# Patient Record
Sex: Male | Born: 1980 | Race: Black or African American | Hispanic: No | Marital: Single | Smoking: Former smoker
Health system: Southern US, Community
[De-identification: ages and names within clinical notes are randomized; demographics above are authoritative.]

## PROBLEM LIST (undated history)

## (undated) DIAGNOSIS — C8190 Hodgkin lymphoma, unspecified, unspecified site: Secondary | ICD-10-CM

---

## 2013-02-09 ENCOUNTER — Emergency Department (HOSPITAL_COMMUNITY)
Admission: EM | Admit: 2013-02-09 | Discharge: 2013-02-09 | Disposition: A | Discharge status: Home / Self Care | Payer: Self-pay | Attending: Emergency Medicine | Admitting: Emergency Medicine

## 2013-02-09 ENCOUNTER — Encounter (HOSPITAL_COMMUNITY): Payer: Self-pay | Admitting: Emergency Medicine

## 2013-02-09 ENCOUNTER — Emergency Department (HOSPITAL_COMMUNITY): Payer: Self-pay

## 2013-02-09 DIAGNOSIS — Z87891 Personal history of nicotine dependence: Secondary | ICD-10-CM | POA: Insufficient documentation

## 2013-02-09 DIAGNOSIS — Z8571 Personal history of Hodgkin lymphoma: Secondary | ICD-10-CM | POA: Insufficient documentation

## 2013-02-09 DIAGNOSIS — J3489 Other specified disorders of nose and nasal sinuses: Secondary | ICD-10-CM | POA: Insufficient documentation

## 2013-02-09 DIAGNOSIS — R1310 Dysphagia, unspecified: Secondary | ICD-10-CM | POA: Insufficient documentation

## 2013-02-09 DIAGNOSIS — Z9481 Bone marrow transplant status: Secondary | ICD-10-CM | POA: Insufficient documentation

## 2013-02-09 DIAGNOSIS — R51 Headache: Secondary | ICD-10-CM | POA: Insufficient documentation

## 2013-02-09 DIAGNOSIS — R609 Edema, unspecified: Secondary | ICD-10-CM | POA: Insufficient documentation

## 2013-02-09 HISTORY — DX: Hodgkin lymphoma, unspecified, unspecified site: C81.90

## 2013-02-09 LAB — COMPREHENSIVE METABOLIC PANEL
Albumin: 4 g/dL (ref 3.5–5.2)
Alkaline Phosphatase: 59 U/L (ref 39–117)
BUN: 10 mg/dL (ref 6–23)
CO2: 27 mEq/L (ref 19–32)
Calcium: 9.4 mg/dL (ref 8.4–10.5)
Chloride: 100 mEq/L (ref 96–112)
GFR calc non Af Amer: >90 mL/min (ref >90)
Glucose, Bld: 122 mg/dL — ABNORMAL HIGH (ref 70–99)
Potassium: 3.5 mEq/L (ref 3.5–5.1)
Sodium: 137 mEq/L (ref 135–145)
Total Bilirubin: 0.3 mg/dL (ref 0.3–1.2)

## 2013-02-09 LAB — CBC
HCT: 42.5 % (ref 39.0–52.0)
Hemoglobin: 14.5 g/dL (ref 13.0–17.0)
RBC: 4.9 MIL/uL (ref 4.22–5.81)
RDW: 13.8 % (ref 11.5–15.5)
WBC: 10.8 10*3/uL — ABNORMAL HIGH (ref 4.0–10.5)

## 2013-02-09 MED ORDER — KETOROLAC TROMETHAMINE 30 MG/ML IJ SOLN
30.0000 mg | Freq: Once | INTRAMUSCULAR | Status: AC
  Administered 2013-02-09: 30 mg via INTRAMUSCULAR
  Filled 2013-02-09: qty 1

## 2013-02-09 MED ORDER — IBUPROFEN 800 MG PO TABS: 800.0000 mg | ORAL_TABLET | Freq: Three times a day (TID) | ORAL | Status: DC

## 2013-02-09 NOTE — ED Notes (Signed)
Pt reports having headache and sinus pressure x 2 weeks, gets worse when he leans his head down. No acute distress noted at triage.

## 2013-02-09 NOTE — ED Provider Notes (Signed)
CSN: [id number]     Arrival date & time 02/09/13  1832 History  This chart was scribed for non-physician practitioner Mellody Drown, PA-C working with Roney Marion, MD by Joaquin Music, ED Scribe. This patient was seen in room TR08C/TR08C and the patient's care was started at 8:42 PM .   Chief Complaint  Patient presents with  . Headache   The history is provided by the patient. No language interpreter was used.   HPI Comments: Nicholas Garcia is a 32 y.o. 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 He states the pressure is constantly throughout his head and states a HA generally follows the pressure. Pt states he tried taking OTC Tylenol but states the Tylenol became ineffective after 2 days. Pt denies taking any current medications. He states he has been having trouble swallowing but denies having a sore throat. He states bending head forward and backward worsen his pressure. Pt denies hx of seizures. Pt denies nausea, vomiting, fever, chills, light-headedness, and visual disturbances.  Pt states he was diagnosed with Hodgkin Disease in 2006 and states he had a bone marrow transplant in 2008. He states his PCP is Dr. Conchita Paris in [place] and states he has not seen his PCP in over 5 months. He states he goes to his PCP when needed.   Past Medical History  Diagnosis Date  . Hodgkin disease    History reviewed. No pertinent past surgical history. History reviewed. No pertinent family history. History  Substance Use Topics  . Smoking status: Former Games developer  . Smokeless tobacco: Not on file  . Alcohol Use: Yes     Comment: social    Review of Systems  Constitutional: Negative for fever and chills.  HENT: Negative for sore throat.   Eyes: Negative for visual disturbance.  Gastrointestinal: Negative for nausea and vomiting.   Neurological: Positive for headaches (w/pressure). Negative for dizziness, seizures and light-headedness.    Allergies  Shrimp  Home Medications  No current outpatient prescriptions on file.  Triage Vitals:BP 143/73  Pulse 96  Temp(Src) 97.6 F (36.4 C) (Oral)  Resp 20  Wt 250 lb (113.399 kg)  SpO2 99%  Physical Exam  Nursing note reviewed. Constitutional: He is oriented to person, place, and time. He appears well-developed and well-nourished.  HENT:  Head: Normocephalic and atraumatic.  Right Ear: Tympanic membrane normal.  Left Ear: Tympanic membrane normal.  Nose: Mucosal edema present.  Mouth/Throat: Uvula is midline and oropharynx is clear and moist.  Slight rhinorrhea.  Eyes: EOM are normal. Pupils are equal, round, and reactive to light. No scleral icterus.  Neck: Normal range of motion. Neck supple. No thyromegaly present.  Cardiovascular: Normal rate, regular rhythm and normal heart sounds.  Exam reveals no gallop and no friction rub.   No murmur heard. Pulmonary/Chest: Effort normal and breath sounds normal. He has no wheezes. He has no rales.  Abdominal: Soft. There is no tenderness. There is no rebound and no guarding.  Neurological: He is alert and oriented to person, place, and time. No cranial nerve deficit or sensory deficit. Coordination normal.  Skin: Skin is warm and dry.  Psychiatric: He has a normal mood and affect. His behavior is normal.    ED Course  Procedures  DIAGNOSTIC STUDIES: Oxygen Saturation is 99% on RA, normal by my interpretation.    COORDINATION OF CARE:  8:59 PM-Discussed treatment plan which includes consulting with attending for further evaluation. Pt agreed to plan.   10:17 PM-Discussed lab and radiology findings with pt. Will D/C pt with pain medication. Pt agreed to plan.  Labs Review Labs Reviewed  CBC - Abnormal; Notable for the following:    WBC 10.8 (*)    All other components within normal limits  COMPREHENSIVE  METABOLIC PANEL - Abnormal; Notable for the following:    Glucose, Bld 122 (*)    ALT 57 (*)    All other components within normal limits   Imaging Review Ct Head Wo Contrast  02/09/2013   CLINICAL DATA:  Headache.  Hodgkin disease.  EXAM: CT HEAD WITHOUT CONTRAST  TECHNIQUE: Contiguous axial images were obtained from the base of the skull through the vertex without intravenous contrast.  COMPARISON:  None.  FINDINGS: No evidence of intracranial hemorrhage, brain edema, or other signs of acute infarction. No evidence of intracranial mass lesion or mass effect. No abnormal extraaxial fluid collections identified. Ventricles are normal in size. No skull abnormality identified. Mucosal thickening is seen involving the sphenoid and right maxillary sinuses.  IMPRESSION: No intracranial abnormality identified.  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   Electronically Signed   By: Myles Rosenthal M.D.   On: 02/09/2013 22:02    EKG Interpretation   None      MDM   1. Headache    Pt with a history of Hodgkin lymphoma s/p bone marrow transplant 2008 presents with a headache for 2 weeks. No neurologic findings on exam. Discussed history of patient with Dr. Fayrene Fearing, who advises obtaining a CT.  CT-without acute findings related to discomfort. Discussed lab results, imaging results, and treatment plan with the patient. Return precautions given. States headache has resolved. Reports understanding and no other concerns at this time.  Patient is stable for discharge at this time.  Meds given in ED:  Medications  ketorolac (TORADOL) 30 MG/ML injection 30 mg (30 mg Intramuscular Given 02/09/13 2250)    Discharge Medication List as of 02/09/2013 11:34 PM    START taking these medications   Details  ibuprofen (ADVIL,MOTRIN) 800 MG tablet Take 1 tablet (800 mg total) by mouth 3 (three) times daily with meals., Starting 02/09/2013, Until Discontinued, Print       I personally performed the  services described in this documentation, which was scribed in my presence. The recorded information has been reviewed and is accurate.     Clabe Seal, PA-C 02/11/13 2256

## 2013-02-13 NOTE — ED Provider Notes (Signed)
Medical screening examination/treatment/procedure(s) were performed by non-physician practitioner and as supervising physician I was immediately available for consultation/collaboration.  EKG Interpretation   None         Aseel Truxillo J Teondra Newburg, MD 02/13/13 0730 

## 2013-02-17 ENCOUNTER — Emergency Department (HOSPITAL_COMMUNITY): Payer: Self-pay

## 2013-02-17 ENCOUNTER — Encounter (HOSPITAL_COMMUNITY): Payer: Self-pay | Admitting: Emergency Medicine

## 2013-02-17 ENCOUNTER — Emergency Department (HOSPITAL_COMMUNITY)
Admission: EM | Admit: 2013-02-17 | Discharge: 2013-02-17 | Disposition: A | Discharge status: Home / Self Care | Payer: Self-pay | Attending: Emergency Medicine | Admitting: Emergency Medicine

## 2013-02-17 DIAGNOSIS — Z8571 Personal history of Hodgkin lymphoma: Secondary | ICD-10-CM | POA: Insufficient documentation

## 2013-02-17 DIAGNOSIS — J111 Influenza due to unidentified influenza virus with other respiratory manifestations: Secondary | ICD-10-CM | POA: Insufficient documentation

## 2013-02-17 DIAGNOSIS — Z87891 Personal history of nicotine dependence: Secondary | ICD-10-CM | POA: Insufficient documentation

## 2013-02-17 DIAGNOSIS — R5381 Other malaise: Secondary | ICD-10-CM | POA: Insufficient documentation

## 2013-02-17 DIAGNOSIS — Z79899 Other long term (current) drug therapy: Secondary | ICD-10-CM | POA: Insufficient documentation

## 2013-02-17 LAB — CBC WITH DIFFERENTIAL/PLATELET
Basophils Absolute: 0 10*3/uL (ref 0.0–0.1)
Eosinophils Absolute: 0.1 10*3/uL (ref 0.0–0.7)
Eosinophils Relative: 1 % (ref 0–5)
Hemoglobin: 15.2 g/dL (ref 13.0–17.0)
Lymphocytes Relative: 17 % (ref 12–46)
Lymphs Abs: 1.7 10*3/uL (ref 0.7–4.0)
MCH: 29.2 pg (ref 26.0–34.0)
MCV: 86.8 fL (ref 78.0–100.0)
Neutro Abs: 7.5 10*3/uL (ref 1.7–7.7)
Neutrophils Relative %: 72 % (ref 43–77)
Platelets: 265 10*3/uL (ref 150–400)
RBC: 5.21 MIL/uL (ref 4.22–5.81)
RDW: 13.8 % (ref 11.5–15.5)
WBC: 10.4 10*3/uL (ref 4.0–10.5)

## 2013-02-17 LAB — URINE MICROSCOPIC-ADD ON

## 2013-02-17 LAB — BASIC METABOLIC PANEL
Calcium: 9.1 mg/dL (ref 8.4–10.5)
Chloride: 96 mEq/L (ref 96–112)
GFR calc non Af Amer: 80 mL/min — ABNORMAL LOW (ref >90)
Sodium: 136 mEq/L (ref 135–145)

## 2013-02-17 LAB — URINALYSIS, ROUTINE W REFLEX MICROSCOPIC
Bilirubin Urine: NEGATIVE
Glucose, UA: 250 mg/dL — AB
Leukocytes, UA: NEGATIVE
Protein, ur: 100 mg/dL — AB
Urobilinogen, UA: 1 mg/dL (ref 0.0–1.0)

## 2013-02-17 MED ORDER — SODIUM CHLORIDE 0.9 % IV BOLUS (SEPSIS)
1000.0000 mL | Freq: Once | INTRAVENOUS | Status: AC
  Administered 2013-02-17: 1000 mL via INTRAVENOUS

## 2013-02-17 MED ORDER — IBUPROFEN 800 MG PO TABS
800.0000 mg | ORAL_TABLET | Freq: Once | ORAL | Status: AC
  Administered 2013-02-17: 800 mg via ORAL
  Filled 2013-02-17: qty 1

## 2013-02-17 NOTE — ED Notes (Signed)
Bed: ZO10 Expected date:  Expected time:  Means of arrival:  Comments: EMS-flu-like symptoms

## 2013-02-17 NOTE — ED Notes (Signed)
As per EMS ,pt sts has been ill for three days. Pt was unable to stand at work and called EMS

## 2013-02-17 NOTE — ED Provider Notes (Signed)
CSN: [id number]     Arrival date & time 02/17/13  4098 History   First MD Initiated Contact with Patient 02/17/13 1017     Chief Complaint  Patient presents with  . Generalized Body Aches   (Consider location/radiation/quality/duration/timing/severity/associated sxs/prior Treatment) HPI  This is a 32 year old male with past medical history of Hodgkin's lymphoma status post bone marrow transplant in 2000 and he was in remission.  The patient presents with chief complaint of influenza-like illness.  The patient has had symptoms of productive cough, chills, myalgias, decreased appetite, fatigue, sweats, and a fever with a maximum temperature of 102.3 last night.  Patient states he has been using NyQuil at night and Alka-Seltzer cold and flu during the day with some relief of his symptoms.  Patient states that this morning he got up and took Alka-Seltzer at approximately 5:30 AM.  Patient states he was feeling better and went to work.  At work he began feeling as if he were going to pass out, he had some nausea, fatigue, weakness, cold sweats.  The patient was brought to the emergency department via EMS.  He states he was too weak to stand at work.  The patient states he gets yearly.  He states that he frequently get very weak during his illnesses.  He has had some associated loose stools over the past few days without vomiting. Denies DOE, SOB, chest tightness or pressure, radiation to left arm, jaw or back, or diaphoresis. Denies dysuria, flank pain, suprapubic pain, frequency, urgency, or hematuria. Denies headaches, light headedness, visual disturbances. Denies abdominal pain, nausea, vomiting, diarrhea or constipation.   Past Medical History  Diagnosis Date  . Hodgkin disease    History reviewed. No pertinent past surgical history. No family history on file. History  Substance Use Topics  . Smoking status: Former Games developer  . Smokeless tobacco: Not on file  . Alcohol Use: Yes     Comment:  social    Review of Systems Ten systems reviewed and are negative for acute change, except as noted in the HPI.   Allergies  Shrimp  Home Medications   Current Outpatient Rx  Name  Route  Sig  Dispense  Refill  . acetaminophen (TYLENOL) 500 MG tablet   Oral   Take 500 mg by mouth every 6 (six) hours as needed.         Marland Kitchen aspirin-sod bicarb-citric acid (ALKA-SELTZER) 325 MG TBEF tablet   Oral   Take 325 mg by mouth every 6 (six) hours as needed.         Marland Kitchen levothyroxine (SYNTHROID, LEVOTHROID) 100 MCG tablet   Oral   Take 100 mcg by mouth daily before breakfast.          BP 110/68  Pulse 98  Temp(Src) 98.3 F (36.8 C) (Oral)  Resp 14  Ht 6\' 3"  (1.905 m)  Wt 250 lb (113.399 kg)  BMI 31.25 kg/m2  SpO2 97% Physical Exam Appears moderately ill but not toxic; temperature as noted in vitals. Ears normal. Eyes:glassy appearance, no discharge  Heart: RRR, NO M/G/R Throat and pharynx normal.   Neck supple. No adenopathyhy in the neck.  Sinuses non tender.  Chest clear, slight productive cough. Abdomen is soft and non-tender.     ED Course  Procedures (including critical care time) Labs Review Labs Reviewed - No data to display Imaging Review No results found.  EKG Interpretation   None       MDM   1. Influenza-like illness  Filed Vitals:   02/17/13 1049 02/17/13 1100 02/17/13 1211 02/17/13 1230  BP: 112/68 118/73 113/77 114/78  Pulse: 108 103 95 93  Temp:   100.2 F (37.9 C)   TempSrc:   Oral   Resp:   16   Height:      Weight:      SpO2:  96% 97% 94%   Pulse trending down. Patient with Ketones, slight proteinuria. I discussed this with the patient and feel he will need a repeat UA in the near future with PCP.  Mild fever after fluids. Patient states he is feeling better. Negative orthostatics. Patient with symptoms consistent with influenza.  Vitals are stable, low-grade fever.  No signs of dehydration, tolerating PO's.  Lungs are clear.  Due to patient's presentation and physical exam a chest x-ray was not ordered bc likely diagnosis of flu.  Discussed the cost versus benefit of Tamiflu treatment with the patient.  The patient understands that symptoms are greater than the recommended 24-48 hour window of treatment.  Patient will be discharged with instructions to orally hydrate, rest, and use over-the-counter medications such as anti-inflammatories ibuprofen and Aleve for muscle aches and Tylenol for fever.  Patient will also be given a cough suppressant.      Arthor Captain, PA-C 02/18/13 1222

## 2013-02-20 NOTE — ED Provider Notes (Signed)
Medical screening examination/treatment/procedure(s) were performed by non-physician practitioner and as supervising physician I was immediately available for consultation/collaboration.  EKG Interpretation   None        Jasmyne Lodato K Linker, MD 02/20/13 0709 

## 2013-10-26 ENCOUNTER — Emergency Department (HOSPITAL_COMMUNITY)
Admission: EM | Admit: 2013-10-26 | Discharge: 2013-10-27 | Disposition: A | Discharge status: Home / Self Care | Payer: Self-pay | Attending: Emergency Medicine | Admitting: Emergency Medicine

## 2013-10-26 DIAGNOSIS — Y9289 Other specified places as the place of occurrence of the external cause: Secondary | ICD-10-CM | POA: Insufficient documentation

## 2013-10-26 DIAGNOSIS — M545 Low back pain, unspecified: Secondary | ICD-10-CM

## 2013-10-26 DIAGNOSIS — IMO0002 Reserved for concepts with insufficient information to code with codable children: Secondary | ICD-10-CM | POA: Insufficient documentation

## 2013-10-26 DIAGNOSIS — X500XXA Overexertion from strenuous movement or load, initial encounter: Secondary | ICD-10-CM | POA: Insufficient documentation

## 2013-10-26 DIAGNOSIS — Y9389 Activity, other specified: Secondary | ICD-10-CM | POA: Insufficient documentation

## 2013-10-26 DIAGNOSIS — M5410 Radiculopathy, site unspecified: Secondary | ICD-10-CM

## 2013-10-26 DIAGNOSIS — Z79899 Other long term (current) drug therapy: Secondary | ICD-10-CM | POA: Insufficient documentation

## 2013-10-26 DIAGNOSIS — Z8571 Personal history of Hodgkin lymphoma: Secondary | ICD-10-CM | POA: Insufficient documentation

## 2013-10-26 DIAGNOSIS — Z87891 Personal history of nicotine dependence: Secondary | ICD-10-CM | POA: Insufficient documentation

## 2013-10-27 ENCOUNTER — Encounter (HOSPITAL_COMMUNITY): Payer: Self-pay | Admitting: Emergency Medicine

## 2013-10-27 MED ORDER — PREDNISONE 20 MG PO TABS
60.0000 mg | ORAL_TABLET | Freq: Once | ORAL | Status: AC
  Administered 2013-10-27: 60 mg via ORAL
  Filled 2013-10-27: qty 3

## 2013-10-27 MED ORDER — PREDNISONE 20 MG PO TABS: ORAL_TABLET | ORAL | Status: AC

## 2013-10-27 MED ORDER — HYDROCODONE-ACETAMINOPHEN 5-325 MG PO TABS: 1.0000 | ORAL_TABLET | Freq: Four times a day (QID) | ORAL | Status: AC | PRN

## 2013-10-27 MED ORDER — METHOCARBAMOL 500 MG PO TABS
750.0000 mg | ORAL_TABLET | Freq: Once | ORAL | Status: AC
  Administered 2013-10-27: 750 mg via ORAL
  Filled 2013-10-27: qty 2

## 2013-10-27 MED ORDER — METHOCARBAMOL 750 MG PO TABS: 750.0000 mg | ORAL_TABLET | Freq: Three times a day (TID) | ORAL | Status: AC

## 2013-10-27 MED ORDER — KETOROLAC TROMETHAMINE 30 MG/ML IJ SOLN
30.0000 mg | Freq: Once | INTRAMUSCULAR | Status: AC
  Administered 2013-10-27: 30 mg via INTRAMUSCULAR
  Filled 2013-10-27: qty 1

## 2013-10-27 MED ORDER — HYDROCODONE-ACETAMINOPHEN 5-325 MG PO TABS
1.0000 | ORAL_TABLET | Freq: Once | ORAL | Status: AC
  Administered 2013-10-27: 1 via ORAL
  Filled 2013-10-27: qty 1

## 2013-10-27 NOTE — ED Provider Notes (Signed)
Medical screening examination/treatment/procedure(s) were performed by non-physician practitioner and as supervising physician I was immediately available for consultation/collaboration.   EKG Interpretation None        Everlene Balls, MD 10/27/13 1558

## 2013-10-27 NOTE — ED Notes (Signed)
Pt states he was getting up off of couch and pulled his back, much like he has before. L leg pain as well. Alert and oriented.

## 2013-10-27 NOTE — ED Provider Notes (Signed)
CSN: [id number]     Arrival date & time 10/26/13  2247 History   First MD Initiated Contact with Patient 10/27/13 0010     Chief Complaint  Patient presents with  . Back Pain     (Consider location/radiation/quality/duration/timing/severity/associated sxs/prior Treatment) HPI Comments: Patient states, that earlier this year while lifting weights.  He caused an injury to his back.  He felt a pop with radiation of pain down.  His left thigh.  He was given medication and physical therapy, which resolved.  The pain.  Tonight, while getting up from a chair and twisting.  He felt the same type of pop in his back.  That radiates to his left posterior thigh.  Has not taken any medication for his symptoms  Patient is a 33 y.o. male presenting with back pain. The history is provided by the patient.  Back Pain Location:  Lumbar spine Quality:  Aching Radiates to:  L posterior upper leg Pain severity:  Moderate Pain is:  Same all the time Onset quality:  Sudden Timing:  Constant Progression:  Unchanged Chronicity:  Recurrent Context: twisting   Relieved by:  None tried Worsened by:  Nothing tried Ineffective treatments:  None tried Associated symptoms: no dysuria, no fever and no numbness     Past Medical History  Diagnosis Date  . Hodgkin disease    History reviewed. No pertinent past surgical history. History reviewed. No pertinent family history. History  Substance Use Topics  . Smoking status: Former Research scientist (life sciences)  . Smokeless tobacco: Not on file  . Alcohol Use: Yes     Comment: social    Review of Systems  Constitutional: Negative for fever.  Genitourinary: Negative for dysuria, frequency and flank pain.  Musculoskeletal: Positive for back pain.  Skin: Negative for rash and wound.  Neurological: Negative for dizziness and numbness.  All other systems reviewed and are negative.     Allergies  Shrimp  Home Medications   Prior to Admission medications   Medication Sig  Start Date End Date Taking? Authorizing Provider  levothyroxine (SYNTHROID, LEVOTHROID) 100 MCG tablet Take 100 mcg by mouth daily before breakfast.   Yes Historical Provider, MD  meloxicam (MOBIC) 15 MG tablet Take 15 mg by mouth daily as needed for pain.   Yes Historical Provider, MD  methocarbamol (ROBAXIN) 750 MG tablet Take 750 mg by mouth every 8 (eight) hours as needed for muscle spasms.   Yes Historical Provider, MD  naproxen (NAPROSYN) 500 MG tablet Take 500 mg by mouth 2 (two) times daily as needed for moderate pain.   Yes Historical Provider, MD   BP 131/79  Pulse 99  Temp(Src) 98.9 F (37.2 C) (Oral)  SpO2 97% Physical Exam  Nursing note and vitals reviewed. Constitutional: He appears well-developed and well-nourished.  HENT:  Head: Normocephalic.  Eyes: Pupils are equal, round, and reactive to light.  Neck: Normal range of motion.  Cardiovascular: Normal rate.   Musculoskeletal: Normal range of motion.  Neurological: He is alert.  Skin: Skin is warm. No rash noted.    ED Course  Procedures (including critical care time) Labs Review Labs Reviewed - No data to display  Imaging Review No results found.   EKG Interpretation None      MDM   Final diagnoses:  None     We'll treat patient with a short course of steroids, as well as muscle relaxer, and pain medication, and instructed him to take these medications on a regular basis.  If  used not getting any significant pain relief.  In the next 2-3, weeks to followup with orthopedic surgery    Garald Balding, NP 10/27/13 [phone]

## 2015-06-13 IMAGING — CT CT HEAD W/O CM
1 series · 16 of 30 positions shown, 20 images · non-contrast
Comparison: None.

CLINICAL DATA: Headache.  Hodgkin disease.

EXAM:
CT HEAD WITHOUT CONTRAST
TECHNIQUE: Contiguous axial images were obtained from the base of the skull
through the vertex without intravenous contrast.

[Series 2: head 5.0 h30s · axial · 0.43mm/px · z∈[+1231,+1366]mm · 16 of 30 slices shown, 20 images]
[im 2/30  brain]
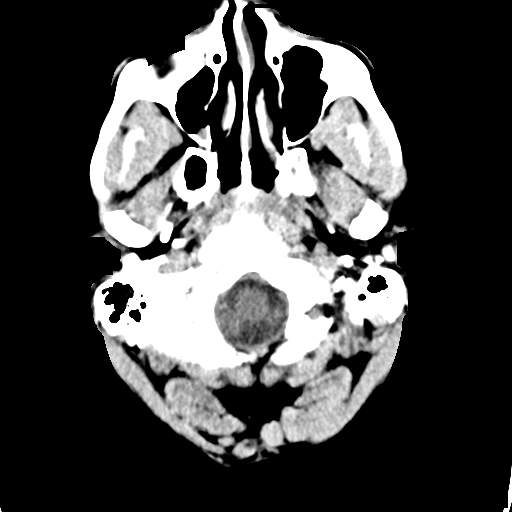
[im 2/30  bone]
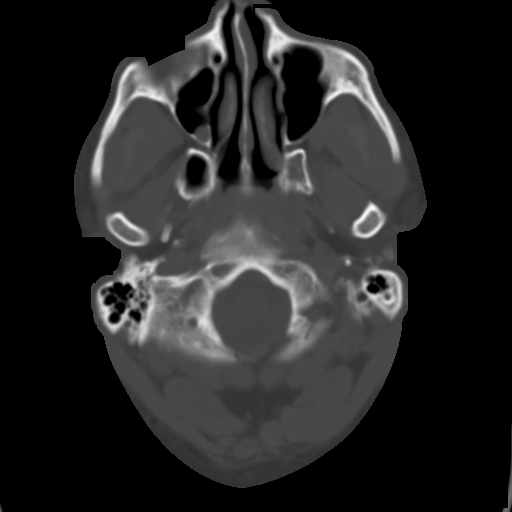
[im 4/30  brain]
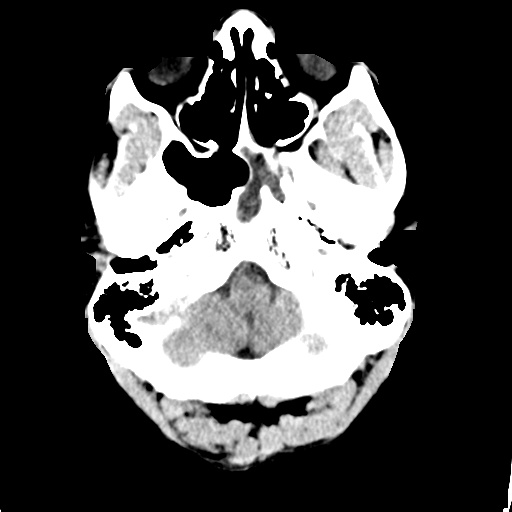
[im 6/30  brain]
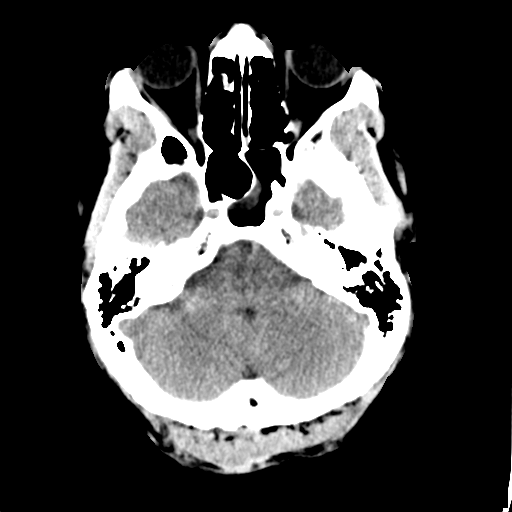
[im 8/30  brain]
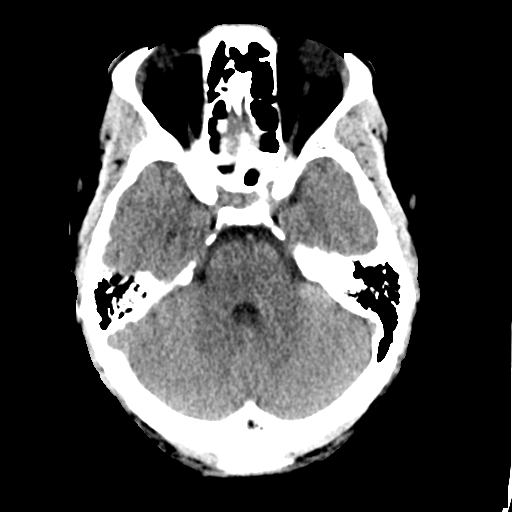
[im 9/30  brain]
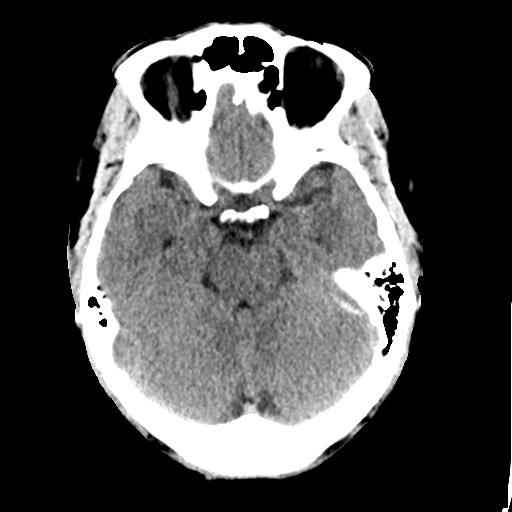
[im 9/30  bone]
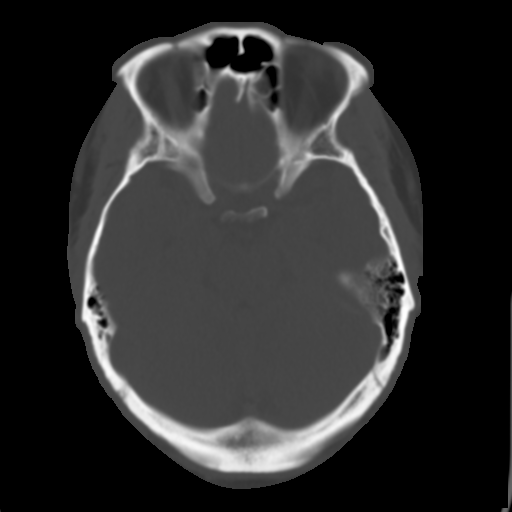
[im 11/30  brain]
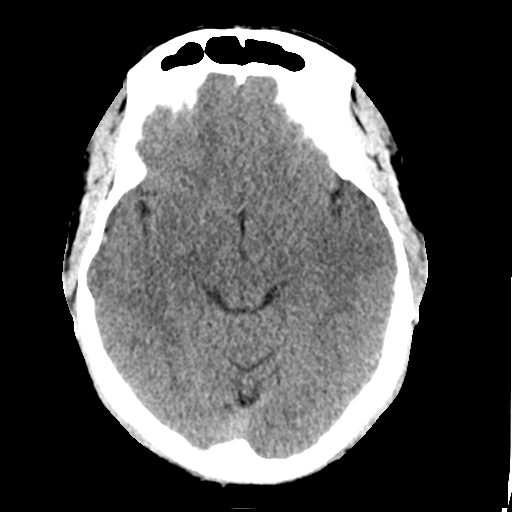
[im 13/30  brain]
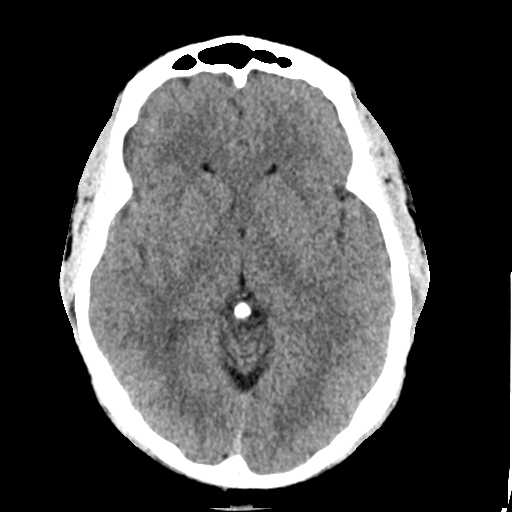
[im 15/30  brain]
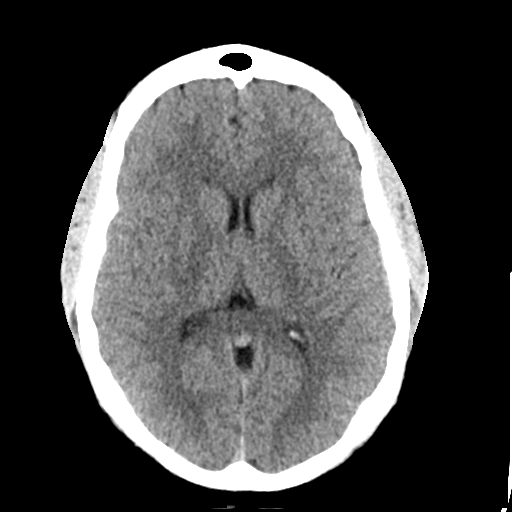
[im 16/30  brain]
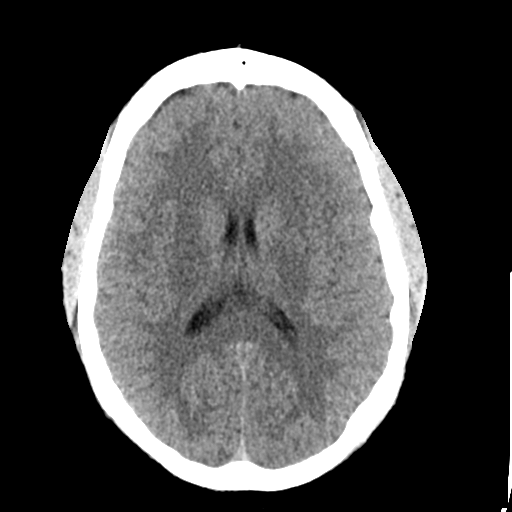
[im 16/30  bone]
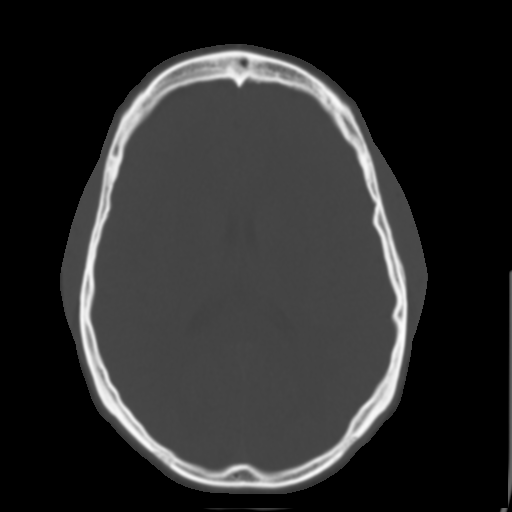
[im 18/30  brain]
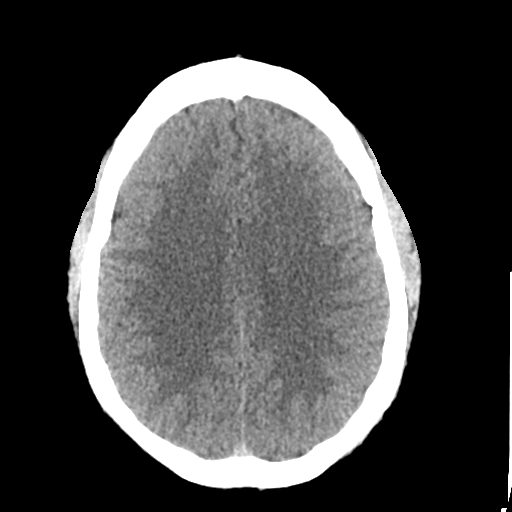
[im 20/30  brain]
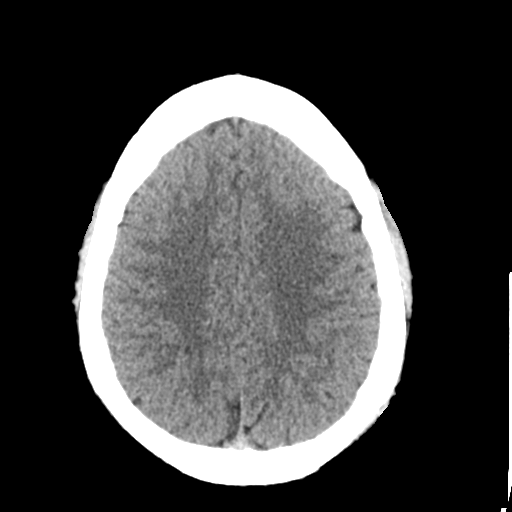
[im 22/30  brain]
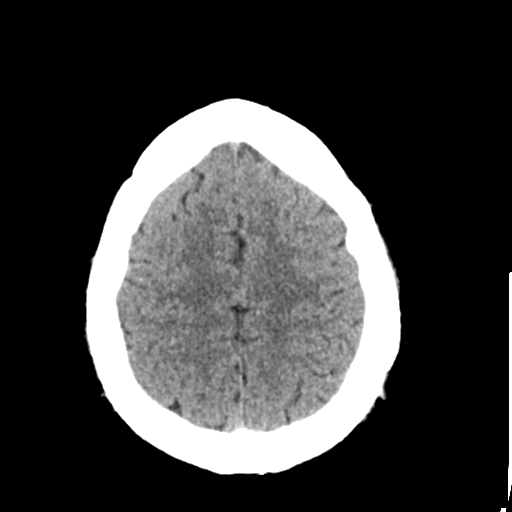
[im 23/30  brain]
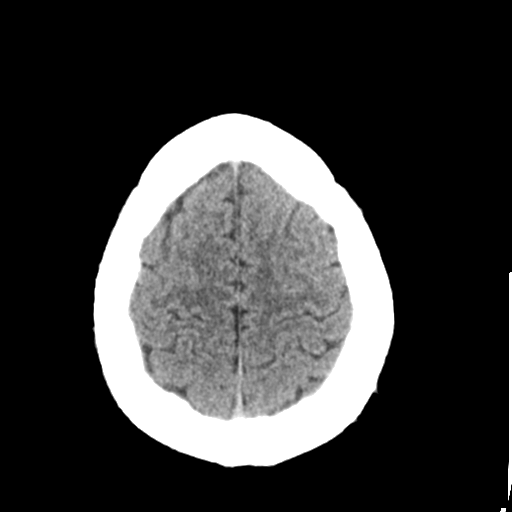
[im 23/30  bone]
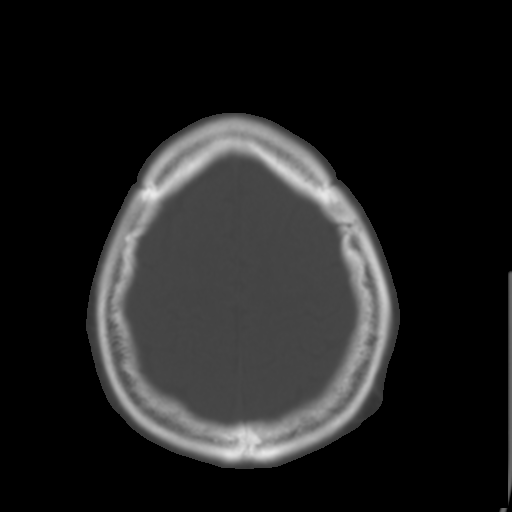
[im 25/30  brain]
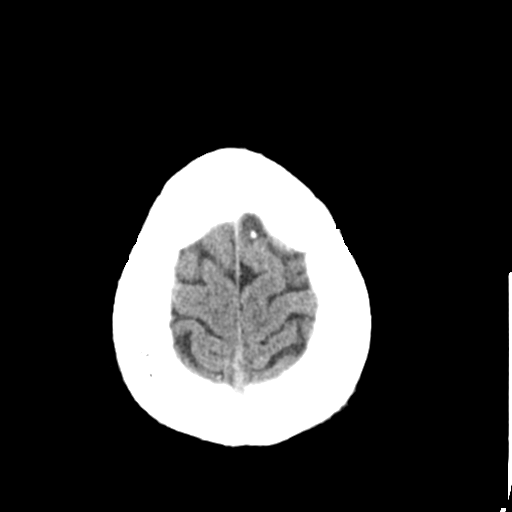
[im 27/30  brain]
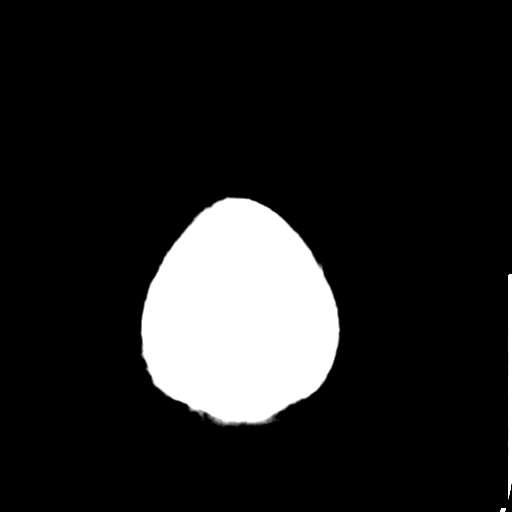
[im 29/30  brain]
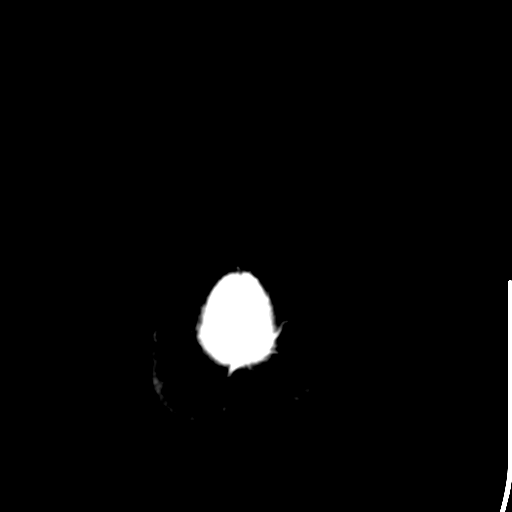

[16 of 30 positions shown; findings below may reference images not displayed]

FINDINGS: No evidence of intracranial hemorrhage, brain edema, or other signs
of acute infarction. No evidence of intracranial mass lesion or mass
effect. No abnormal extraaxial fluid collections identified.
Ventricles are normal in size. No skull abnormality identified.
Mucosal thickening is seen involving the sphenoid and right
maxillary sinuses.
IMPRESSION: No intracranial abnormality identified.

Chronic right maxillary and sphenoid sinus disease.

## 2015-06-21 IMAGING — CR DG CHEST 2V
2 series · 2 of 2 positions shown · non-contrast
Comparison: None.

CLINICAL DATA: Shortness of Breath

EXAM:
CHEST  2 VIEW

[w chest pa]
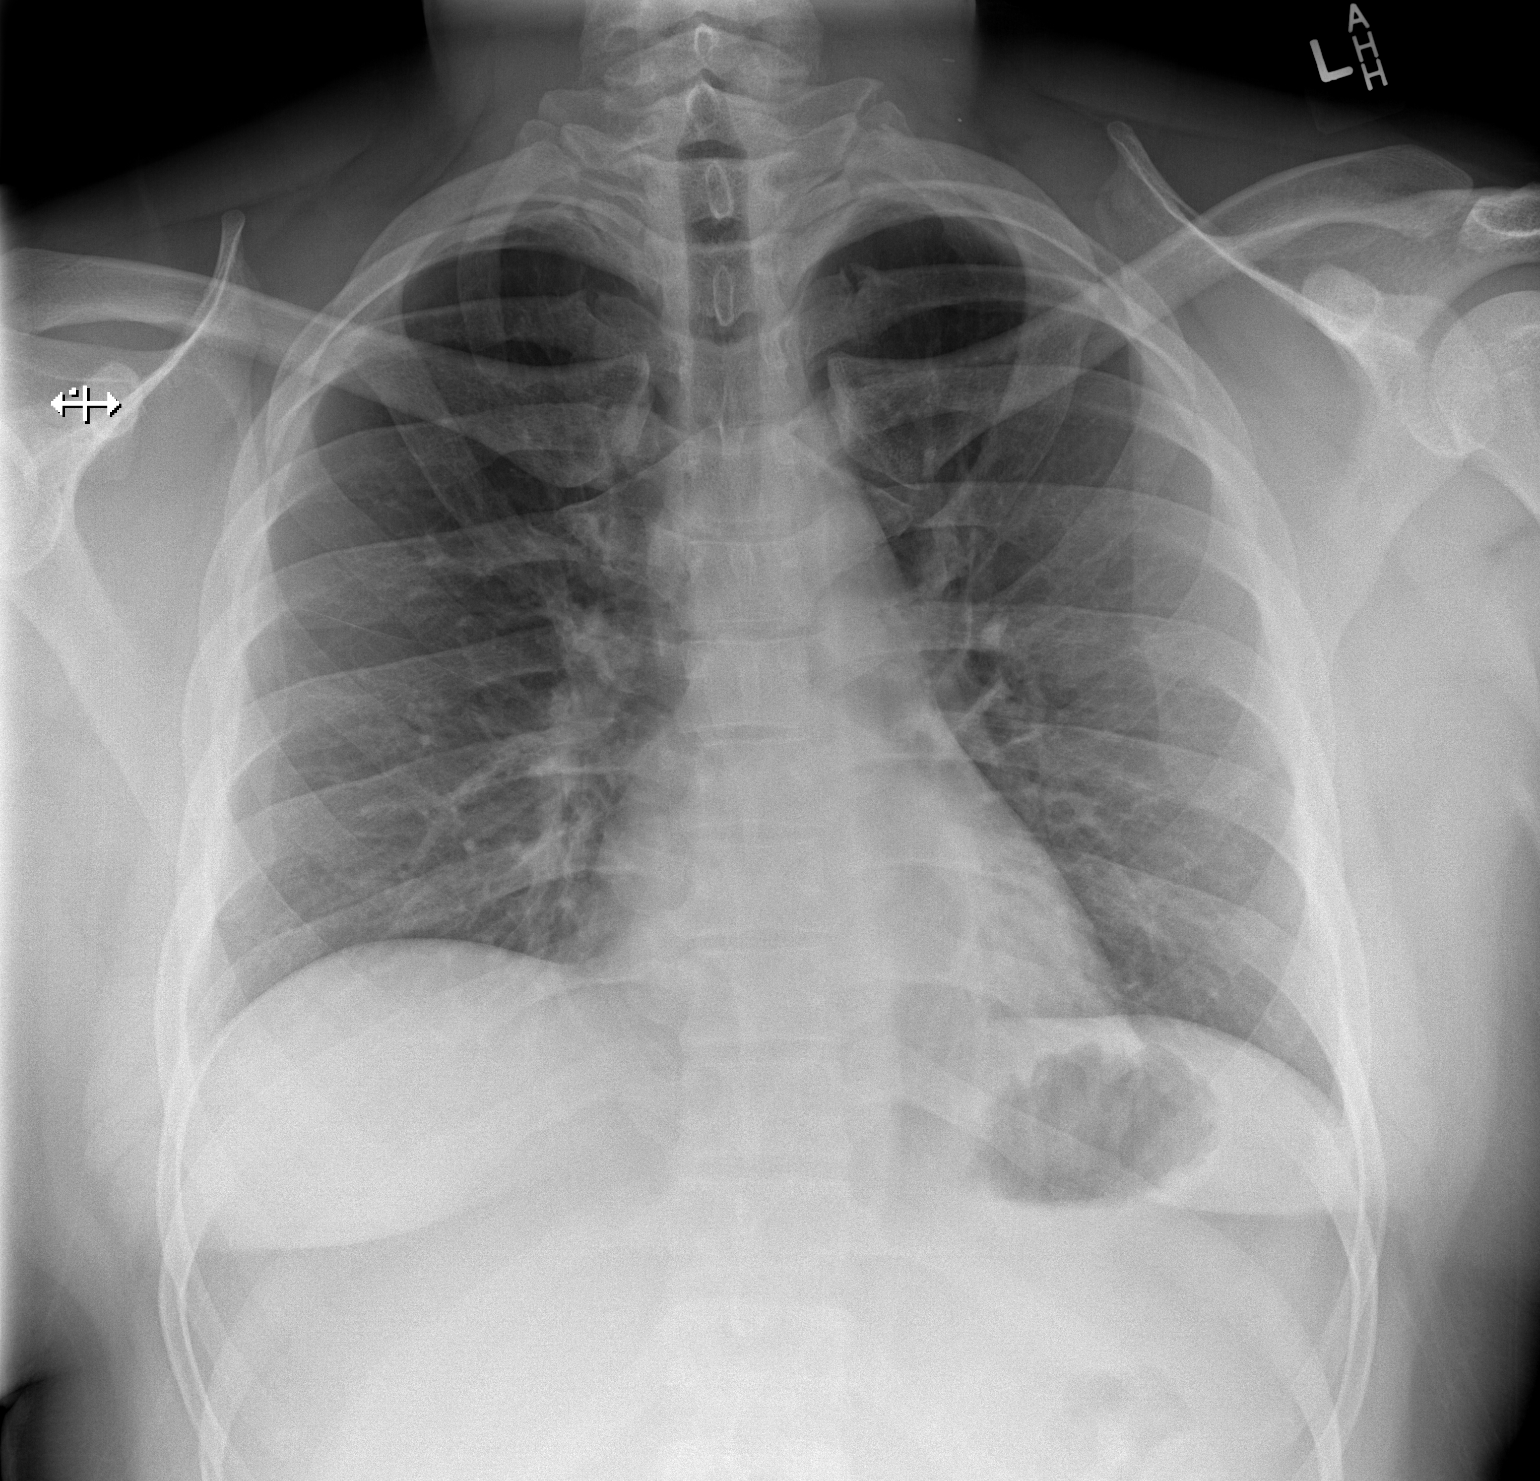

[w chest lat]
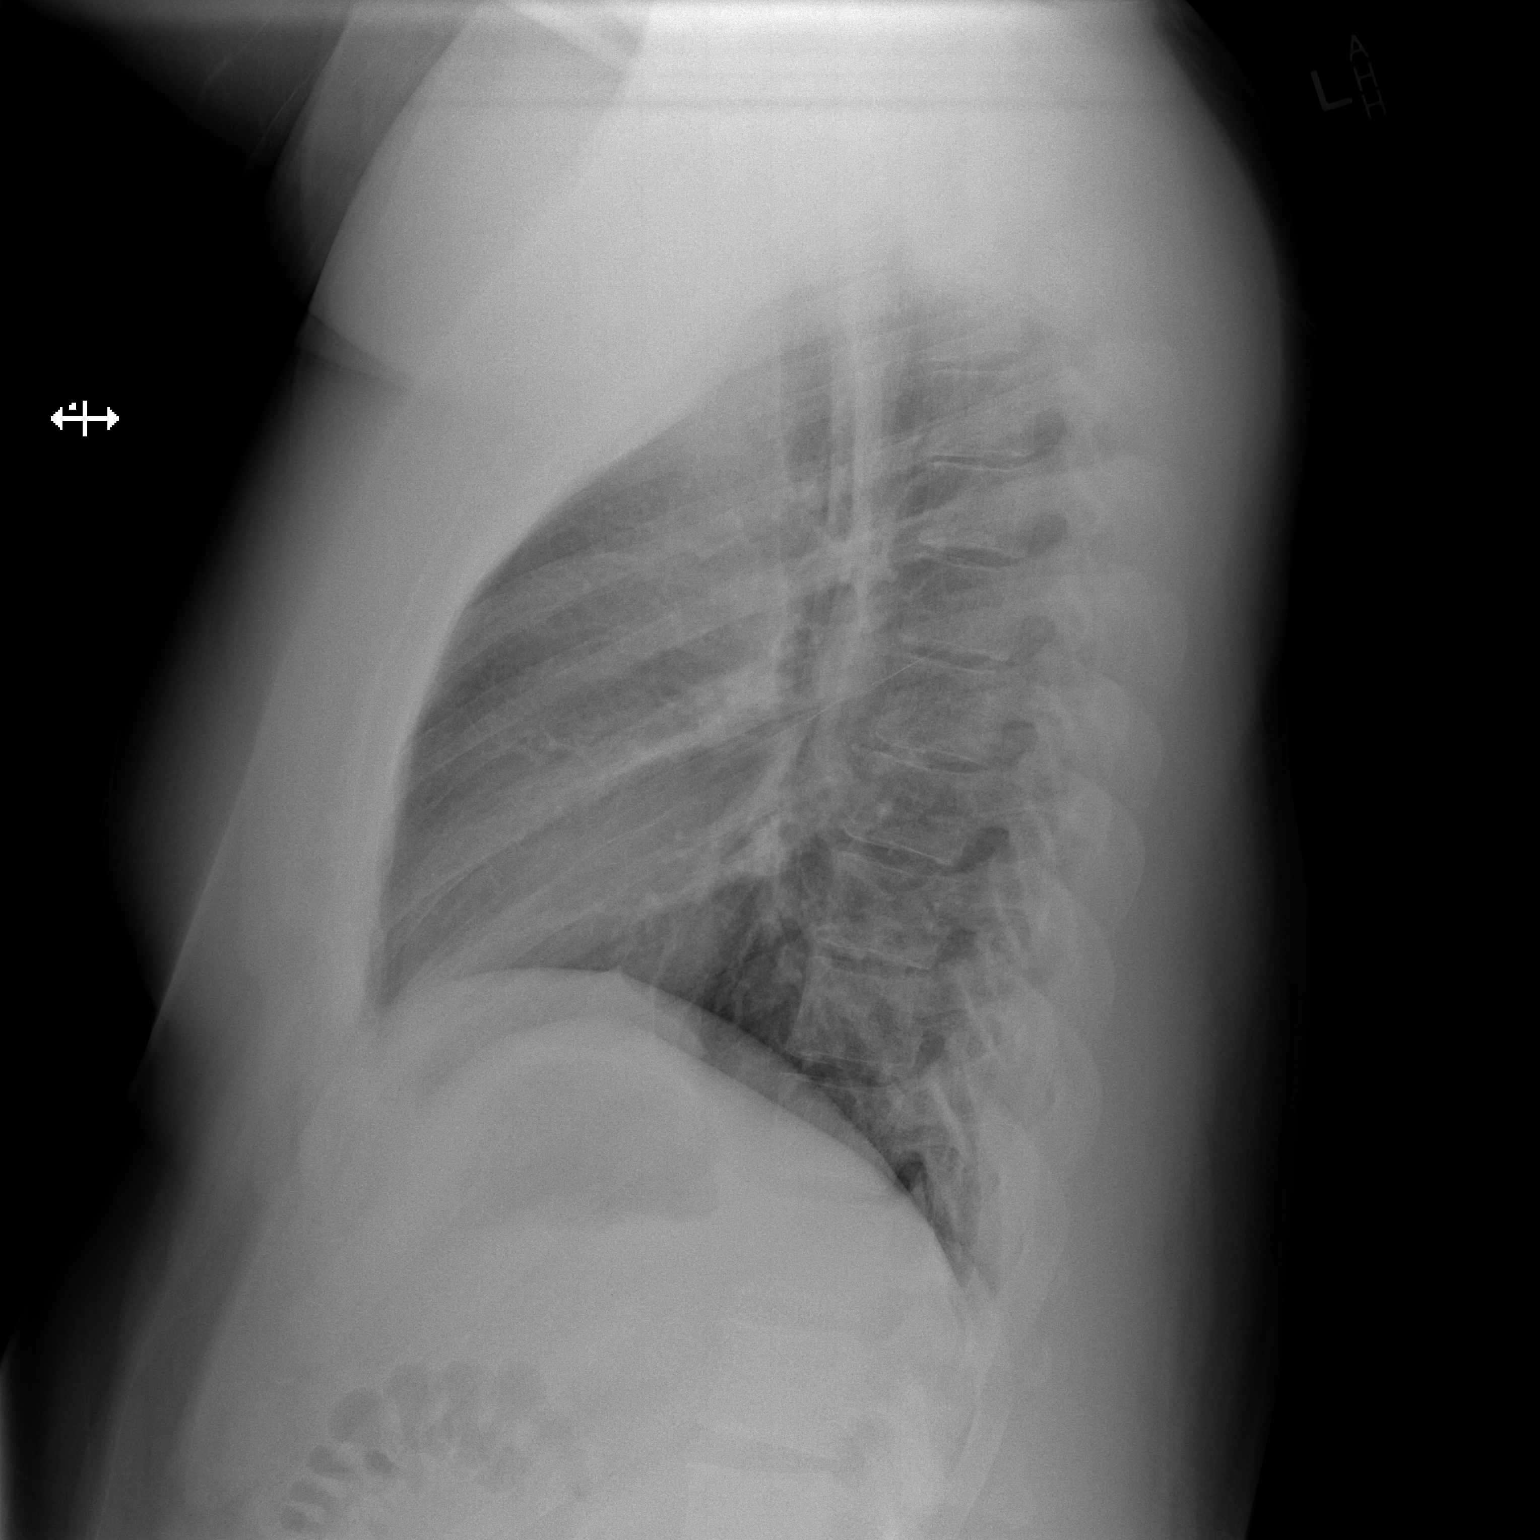

[2 of 2 positions shown; findings below may reference images not displayed]

FINDINGS: The heart size and mediastinal contours are within normal limits.
Both lungs are clear. The visualized skeletal structures are
unremarkable.
IMPRESSION: No active cardiopulmonary disease.
